# Patient Record
Sex: Female | Born: 1975 | Hispanic: Yes | Marital: Married | Smoking: Never smoker
Health system: Southern US, Community
[De-identification: ages and names within clinical notes are randomized; demographics above are authoritative.]

## PROBLEM LIST (undated history)

## (undated) DIAGNOSIS — R7303 Prediabetes: Secondary | ICD-10-CM

## (undated) DIAGNOSIS — R0600 Dyspnea, unspecified: Secondary | ICD-10-CM

## (undated) DIAGNOSIS — M542 Cervicalgia: Secondary | ICD-10-CM

## (undated) DIAGNOSIS — G971 Other reaction to spinal and lumbar puncture: Secondary | ICD-10-CM

## (undated) DIAGNOSIS — E538 Deficiency of other specified B group vitamins: Secondary | ICD-10-CM

## (undated) DIAGNOSIS — F419 Anxiety disorder, unspecified: Secondary | ICD-10-CM

## (undated) DIAGNOSIS — Q231 Congenital insufficiency of aortic valve: Secondary | ICD-10-CM

## (undated) DIAGNOSIS — R002 Palpitations: Secondary | ICD-10-CM

## (undated) DIAGNOSIS — Q2381 Bicuspid aortic valve: Secondary | ICD-10-CM

## (undated) DIAGNOSIS — J189 Pneumonia, unspecified organism: Secondary | ICD-10-CM

## (undated) DIAGNOSIS — M47812 Spondylosis without myelopathy or radiculopathy, cervical region: Secondary | ICD-10-CM

## (undated) DIAGNOSIS — J4520 Mild intermittent asthma, uncomplicated: Secondary | ICD-10-CM

## (undated) DIAGNOSIS — L659 Nonscarring hair loss, unspecified: Secondary | ICD-10-CM

## (undated) HISTORY — PX: APPENDECTOMY: SHX54

## (undated) HISTORY — PX: ABDOMINAL HYSTERECTOMY: SHX81

## (undated) HISTORY — PX: OTHER SURGICAL HISTORY: SHX169

---

## 2020-09-27 ENCOUNTER — Other Ambulatory Visit: Payer: Self-pay | Admitting: Physical Medicine & Rehabilitation

## 2020-09-27 DIAGNOSIS — G8929 Other chronic pain: Secondary | ICD-10-CM

## 2020-09-27 DIAGNOSIS — M542 Cervicalgia: Secondary | ICD-10-CM

## 2020-09-27 DIAGNOSIS — M5441 Lumbago with sciatica, right side: Secondary | ICD-10-CM

## 2020-09-27 DIAGNOSIS — M5412 Radiculopathy, cervical region: Secondary | ICD-10-CM

## 2020-09-29 NOTE — Progress Notes (Signed)
Patient on schedule for myelogram 9/29, called and spoke with patient on phone with pre procedure instructions given. Made aware to be here @ 0930, only liquids after MN, NPO after 0500, driver post procedure/recovery/discharge. Expect to be here at least 2 hours post procedure/recovery. Patient stated not taking buspar,holding meloxicam after Monday 9/26, stated understanding.

## 2020-10-05 ENCOUNTER — Ambulatory Visit: Admission: RE | Admit: 2020-10-05 | Payer: Managed Care, Other (non HMO) | Source: Ambulatory Visit

## 2020-10-05 ENCOUNTER — Ambulatory Visit
Admission: RE | Admit: 2020-10-05 | Discharge: 2020-10-05 | Disposition: A | Discharge status: Home / Self Care | Payer: Managed Care, Other (non HMO) | Source: Ambulatory Visit | Attending: Physical Medicine & Rehabilitation | Admitting: Physical Medicine & Rehabilitation

## 2020-10-06 ENCOUNTER — Other Ambulatory Visit: Payer: Self-pay | Admitting: Physical Medicine & Rehabilitation

## 2020-10-06 DIAGNOSIS — G8929 Other chronic pain: Secondary | ICD-10-CM

## 2020-10-06 DIAGNOSIS — M542 Cervicalgia: Secondary | ICD-10-CM

## 2020-10-12 ENCOUNTER — Other Ambulatory Visit: Payer: Managed Care, Other (non HMO)

## 2020-10-13 ENCOUNTER — Ambulatory Visit
Admission: RE | Admit: 2020-10-13 | Discharge: 2020-10-13 | Disposition: A | Discharge status: Home / Self Care | Payer: Managed Care, Other (non HMO) | Source: Ambulatory Visit | Attending: Physical Medicine & Rehabilitation | Admitting: Physical Medicine & Rehabilitation

## 2020-10-13 ENCOUNTER — Other Ambulatory Visit: Payer: Self-pay

## 2020-10-13 DIAGNOSIS — M542 Cervicalgia: Secondary | ICD-10-CM

## 2020-10-13 DIAGNOSIS — G8929 Other chronic pain: Secondary | ICD-10-CM

## 2020-10-13 DIAGNOSIS — M545 Low back pain, unspecified: Secondary | ICD-10-CM

## 2020-10-13 MED ORDER — IOPAMIDOL (ISOVUE-M 300) INJECTION 61%
10.0000 mL | Freq: Once | INTRAMUSCULAR | Status: AC | PRN
  Administered 2020-10-13: 10 mL via INTRATHECAL

## 2020-10-13 MED ORDER — DIAZEPAM 5 MG PO TABS
10.0000 mg | ORAL_TABLET | Freq: Once | ORAL | Status: AC
  Administered 2020-10-13: 10 mg via ORAL

## 2020-10-13 MED ORDER — ONDANSETRON HCL 4 MG/2ML IJ SOLN: 4.0000 mg | Freq: Once | INTRAMUSCULAR | Status: DC | PRN

## 2020-10-13 MED ORDER — MEPERIDINE HCL 50 MG/ML IJ SOLN: 50.0000 mg | Freq: Once | INTRAMUSCULAR | Status: DC | PRN

## 2020-10-13 NOTE — Progress Notes (Signed)
Pt reports spinal cord stimulator has been switched into procedure mode for myelogram procedure.

## 2020-10-13 NOTE — Discharge Instructions (Signed)

## 2020-10-16 ENCOUNTER — Other Ambulatory Visit: Payer: Self-pay | Admitting: Physical Medicine & Rehabilitation

## 2020-10-16 DIAGNOSIS — G8929 Other chronic pain: Secondary | ICD-10-CM

## 2020-10-17 ENCOUNTER — Ambulatory Visit
Admission: RE | Admit: 2020-10-17 | Discharge: 2020-10-17 | Disposition: A | Discharge status: Home / Self Care | Payer: Managed Care, Other (non HMO) | Source: Ambulatory Visit | Attending: Physical Medicine & Rehabilitation | Admitting: Physical Medicine & Rehabilitation

## 2020-10-17 ENCOUNTER — Other Ambulatory Visit: Payer: Self-pay

## 2020-10-17 DIAGNOSIS — G8929 Other chronic pain: Secondary | ICD-10-CM

## 2020-10-17 MED ORDER — IOPAMIDOL (ISOVUE-M 200) INJECTION 41%
1.0000 mL | Freq: Once | INTRAMUSCULAR | Status: AC
  Administered 2020-10-17: 1 mL via EPIDURAL

## 2020-10-17 NOTE — Discharge Instructions (Signed)

## 2020-10-17 NOTE — Progress Notes (Signed)
20cc of blood drawn from pts R AC for blood patch. 1 successful attempt, pt tolerated well. Gauze and tape applied after.  

## 2020-11-15 ENCOUNTER — Other Ambulatory Visit: Payer: Self-pay | Admitting: Neurosurgery

## 2020-12-06 ENCOUNTER — Other Ambulatory Visit: Payer: Managed Care, Other (non HMO)

## 2020-12-07 ENCOUNTER — Encounter
Admission: RE | Admit: 2020-12-07 | Discharge: 2020-12-07 | Disposition: A | Discharge status: Home / Self Care | Payer: Managed Care, Other (non HMO) | Source: Ambulatory Visit | Attending: Neurosurgery | Admitting: Neurosurgery

## 2020-12-07 ENCOUNTER — Other Ambulatory Visit: Payer: Self-pay

## 2020-12-07 DIAGNOSIS — Q231 Congenital insufficiency of aortic valve: Secondary | ICD-10-CM | POA: Diagnosis not present

## 2020-12-07 DIAGNOSIS — Z01818 Encounter for other preprocedural examination: Secondary | ICD-10-CM | POA: Diagnosis not present

## 2020-12-07 HISTORY — DX: Nonscarring hair loss, unspecified: L65.9

## 2020-12-07 HISTORY — DX: Dyspnea, unspecified: R06.00

## 2020-12-07 HISTORY — DX: Palpitations: R00.2

## 2020-12-07 HISTORY — DX: Anxiety disorder, unspecified: F41.9

## 2020-12-07 HISTORY — DX: Congenital insufficiency of aortic valve: Q23.1

## 2020-12-07 HISTORY — DX: Other reaction to spinal and lumbar puncture: G97.1

## 2020-12-07 HISTORY — DX: Mild intermittent asthma, uncomplicated: J45.20

## 2020-12-07 HISTORY — DX: Spondylosis without myelopathy or radiculopathy, cervical region: M47.812

## 2020-12-07 HISTORY — DX: Pneumonia, unspecified organism: J18.9

## 2020-12-07 HISTORY — DX: Prediabetes: R73.03

## 2020-12-07 HISTORY — DX: Deficiency of other specified B group vitamins: E53.8

## 2020-12-07 HISTORY — DX: Bicuspid aortic valve: Q23.81

## 2020-12-07 HISTORY — DX: Cervicalgia: M54.2

## 2020-12-07 LAB — CBC
HCT: 41.3 % (ref 36.0–46.0)
Hemoglobin: 13.5 g/dL (ref 12.0–15.0)
MCH: 29.2 pg (ref 26.0–34.0)
MCHC: 32.7 g/dL (ref 30.0–36.0)
MCV: 89.4 fL (ref 80.0–100.0)
Platelets: 299 10*3/uL (ref 150–400)
RBC: 4.62 MIL/uL (ref 3.87–5.11)
RDW: 13.2 % (ref 11.5–15.5)
WBC: 8.2 10*3/uL (ref 4.0–10.5)
nRBC: 0 % (ref 0.0–0.2)

## 2020-12-07 LAB — URINALYSIS, ROUTINE W REFLEX MICROSCOPIC
Bilirubin Urine: NEGATIVE
Glucose, UA: NEGATIVE mg/dL
Ketones, ur: NEGATIVE mg/dL
Leukocytes,Ua: NEGATIVE
Nitrite: NEGATIVE
Protein, ur: NEGATIVE mg/dL
Specific Gravity, Urine: 1.025 (ref 1.005–1.030)
pH: 6.5 (ref 5.0–8.0)

## 2020-12-07 LAB — BASIC METABOLIC PANEL
Anion gap: 6 (ref 5–15)
BUN: 12 mg/dL (ref 6–20)
CO2: 27 mmol/L (ref 22–32)
Calcium: 8.9 mg/dL (ref 8.9–10.3)
Chloride: 103 mmol/L (ref 98–111)
Creatinine, Ser: 0.53 mg/dL (ref 0.44–1.00)
GFR, Estimated: >60 mL/min (ref >60)
Glucose, Bld: 90 mg/dL (ref 70–99)
Potassium: 4 mmol/L (ref 3.5–5.1)
Sodium: 136 mmol/L (ref 135–145)

## 2020-12-07 LAB — URINALYSIS, MICROSCOPIC (REFLEX): WBC, UA: NONE SEEN WBC/hpf (ref 0–5)

## 2020-12-07 LAB — TYPE AND SCREEN
ABO/RH(D): A POS
Antibody Screen: NEGATIVE

## 2020-12-07 LAB — APTT: aPTT: 29 seconds (ref 24–36)

## 2020-12-07 LAB — PROTIME-INR
INR: 1 (ref 0.8–1.2)
Prothrombin Time: 13 seconds (ref 11.4–15.2)

## 2020-12-07 LAB — SURGICAL PCR SCREEN
MRSA, PCR: NEGATIVE
Staphylococcus aureus: NEGATIVE

## 2020-12-07 NOTE — Patient Instructions (Addendum)
Your procedure is scheduled on: 12/18/20 - Monday Report to the Registration Desk on the 1st floor of the [facility]. To find out your arrival time, please call [phone] between 1PM - 3PM on: 12/15/20   REMEMBER: Instructions that are not followed completely may result in serious medical risk, up to and including death; or upon the discretion of your surgeon and anesthesiologist your surgery may need to be rescheduled.  Do not eat food after midnight the night before surgery.  No gum chewing, lozengers or hard candies.  You may however, drink CLEAR liquids up to 2 hours before you are scheduled to arrive for your surgery. Do not drink anything within 2 hours of your scheduled arrival time.  Clear liquids include: - water  - apple juice without pulp - gatorade (not RED, PURPLE, OR BLUE) - black coffee or tea (Do NOT add milk or creamers to the coffee or tea) Do NOT drink anything that is not on this list.  TAKE THESE MEDICATIONS THE MORNING OF SURGERY WITH A SIP OF WATER:  - busPIRone (BUSPAR) 5 MG tablet - Use albuterol (VENTOLIN HFA) 108 (90 Base) MCG/ACT inhaler on the day of surgery and bring to the hospital.  One week prior to surgery: Hold these medications 1 week prior to procedure, may resume taking 3 months after procedure. Stop Anti-inflammatories (NSAIDS) such as Advil, Aleve, Ibuprofen, Motrin, Naproxen, Naprosyn and Aspirin based products such as Excedrin, Goodys Powder, BC Powder.  Stop ANY OVER THE COUNTER supplements until after surgery.  You may however, continue to take Tylenol if needed for pain up until the day of surgery.  No Alcohol for 24 hours before or after surgery.  No Smoking including e-cigarettes for 24 hours prior to surgery.  No chewable tobacco products for at least 6 hours prior to surgery.  No nicotine patches on the day of surgery.  Do not use any "recreational" drugs for at least a week prior to your surgery.  Please be advised that  the combination of cocaine and anesthesia may have negative outcomes, up to and including death. If you test positive for cocaine, your surgery will be cancelled.  On the morning of surgery brush your teeth with toothpaste and water, you may rinse your mouth with mouthwash if you wish. Do not swallow any toothpaste or mouthwash.  Use CHG Soap or wipes as directed on instruction sheet.  Do not wear jewelry, make-up, hairpins, clips or nail polish.  Do not wear lotions, powders, or perfumes.   Do not shave body from the neck down 48 hours prior to surgery just in case you cut yourself which could leave a site for infection.  Also, freshly shaved skin may become irritated if using the CHG soap.  Contact lenses, hearing aids and dentures may not be worn into surgery.  Do not bring valuables to the hospital. [facility] is not responsible for any missing/lost belongings or valuables.   Notify your doctor if there is any change in your medical condition (cold, fever, infection).  Wear comfortable clothing (specific to your surgery type) to the hospital.  After surgery, you can help prevent lung complications by doing breathing exercises.  Take deep breaths and cough every 1-2 hours. Your doctor may order a device called an Incentive Spirometer to help you take deep breaths. When coughing or sneezing, hold a pillow firmly against your incision with both hands. This is called "splinting." Doing this helps protect your incision. It also decreases belly discomfort.  If you are being admitted to the hospital overnight, leave your suitcase in the car. After surgery it may be brought to your room.  If you are being discharged the day of surgery, you will not be allowed to drive home. You will need a responsible adult (18 years or older) to drive you home and stay with you that night.   If you are taking public transportation, you will need to have a responsible adult (18 years or older) with  you. Please confirm with your physician that it is acceptable to use public transportation.   Please call the [facility] at [phone] if you have any questions about these instructions.  Surgery Visitation Policy:  Patients undergoing a surgery or procedure may have one family member or support person with them as long as that person is not COVID-19 positive or experiencing its symptoms.  That person may remain in the waiting area during the procedure and may rotate out with other people.  Inpatient Visitation:    Visiting hours are 7 a.m. to 8 p.m. Up to two visitors ages 16+ are allowed at one time in a patient room. The visitors may rotate out with other people during the day. Visitors must check out when they leave, or other visitors will not be allowed. One designated support person may remain overnight. The visitor must pass COVID-19 screenings, use hand sanitizer when entering and exiting the patient's room and wear a mask at all times, including in the patient's room. Patients must also wear a mask when staff or their visitor are in the room. Masking is required regardless of vaccination status.

## 2020-12-17 MED ORDER — CHLORHEXIDINE GLUCONATE 0.12 % MT SOLN: 15.0000 mL | Freq: Once | OROMUCOSAL | Status: AC

## 2020-12-17 MED ORDER — LACTATED RINGERS IV SOLN: INTRAVENOUS | Status: DC

## 2020-12-17 MED ORDER — FAMOTIDINE 20 MG PO TABS: 20.0000 mg | ORAL_TABLET | Freq: Once | ORAL | Status: AC

## 2020-12-17 MED ORDER — ORAL CARE MOUTH RINSE: 15.0000 mL | Freq: Once | OROMUCOSAL | Status: AC

## 2020-12-18 ENCOUNTER — Other Ambulatory Visit: Payer: Self-pay

## 2020-12-18 ENCOUNTER — Encounter
Admission: RE | Disposition: A | Discharge status: Home / Self Care | Payer: Self-pay | Source: Ambulatory Visit | Attending: Neurosurgery

## 2020-12-18 ENCOUNTER — Ambulatory Visit
Admission: RE | Admit: 2020-12-18 | Discharge: 2020-12-18 | Disposition: A | Discharge status: Home / Self Care | Payer: Managed Care, Other (non HMO) | Source: Ambulatory Visit | Attending: Neurosurgery | Admitting: Neurosurgery

## 2020-12-18 ENCOUNTER — Ambulatory Visit: Payer: Managed Care, Other (non HMO) | Admitting: Anesthesiology

## 2020-12-18 ENCOUNTER — Ambulatory Visit: Payer: Managed Care, Other (non HMO) | Admitting: Urgent Care

## 2020-12-18 ENCOUNTER — Encounter: Payer: Self-pay | Admitting: Neurosurgery

## 2020-12-18 ENCOUNTER — Ambulatory Visit: Payer: Managed Care, Other (non HMO)

## 2020-12-18 DIAGNOSIS — M4802 Spinal stenosis, cervical region: Secondary | ICD-10-CM | POA: Insufficient documentation

## 2020-12-18 DIAGNOSIS — M1990 Unspecified osteoarthritis, unspecified site: Secondary | ICD-10-CM | POA: Insufficient documentation

## 2020-12-18 DIAGNOSIS — Z419 Encounter for procedure for purposes other than remedying health state, unspecified: Secondary | ICD-10-CM

## 2020-12-18 DIAGNOSIS — M5126 Other intervertebral disc displacement, lumbar region: Secondary | ICD-10-CM | POA: Insufficient documentation

## 2020-12-18 DIAGNOSIS — M5412 Radiculopathy, cervical region: Secondary | ICD-10-CM | POA: Insufficient documentation

## 2020-12-18 DIAGNOSIS — J45909 Unspecified asthma, uncomplicated: Secondary | ICD-10-CM | POA: Insufficient documentation

## 2020-12-18 HISTORY — PX: SPINAL CORD STIMULATOR REMOVAL: SHX5379

## 2020-12-18 HISTORY — PX: ANTERIOR CERVICAL DECOMP/DISCECTOMY FUSION: SHX1161

## 2020-12-18 LAB — ABO/RH: ABO/RH(D): A POS

## 2020-12-18 SURGERY — LUMBAR SPINAL CORD STIMULATOR REMOVAL
Anesthesia: General | Site: Neck

## 2020-12-18 MED ORDER — OXYCODONE HCL 5 MG/5ML PO SOLN: 5.0000 mg | Freq: Once | ORAL | Status: AC | PRN

## 2020-12-18 MED ORDER — PROPOFOL 10 MG/ML IV BOLUS
INTRAVENOUS | Status: AC
  Filled 2020-12-18: qty 80

## 2020-12-18 MED ORDER — FENTANYL CITRATE (PF) 100 MCG/2ML IJ SOLN
25.0000 ug | INTRAMUSCULAR | Status: DC | PRN
  Administered 2020-12-18: 25 ug via INTRAVENOUS

## 2020-12-18 MED ORDER — FENTANYL CITRATE (PF) 250 MCG/5ML IJ SOLN
INTRAMUSCULAR | Status: AC
  Filled 2020-12-18: qty 5

## 2020-12-18 MED ORDER — ROCURONIUM BROMIDE 100 MG/10ML IV SOLN
INTRAVENOUS | Status: DC | PRN
  Administered 2020-12-18: 40 mg via INTRAVENOUS

## 2020-12-18 MED ORDER — BUPIVACAINE-EPINEPHRINE (PF) 0.5% -1:200000 IJ SOLN
INTRAMUSCULAR | Status: AC
  Filled 2020-12-18: qty 30

## 2020-12-18 MED ORDER — METHOCARBAMOL 1000 MG/10ML IJ SOLN
500.0000 mg | Freq: Once | INTRAVENOUS | Status: DC
  Filled 2020-12-18: qty 5

## 2020-12-18 MED ORDER — PHENYLEPHRINE HCL (PRESSORS) 10 MG/ML IV SOLN
INTRAVENOUS | Status: AC
  Filled 2020-12-18: qty 1

## 2020-12-18 MED ORDER — PROMETHAZINE HCL 25 MG/ML IJ SOLN: 6.2500 mg | INTRAMUSCULAR | Status: DC | PRN

## 2020-12-18 MED ORDER — CHLORHEXIDINE GLUCONATE 0.12 % MT SOLN
OROMUCOSAL | Status: AC
  Administered 2020-12-18: 15 mL via OROMUCOSAL
  Filled 2020-12-18: qty 15

## 2020-12-18 MED ORDER — MIDAZOLAM HCL 2 MG/2ML IJ SOLN
INTRAMUSCULAR | Status: AC
  Filled 2020-12-18: qty 2

## 2020-12-18 MED ORDER — FAMOTIDINE 20 MG PO TABS
ORAL_TABLET | ORAL | Status: AC
  Administered 2020-12-18: 20 mg via ORAL
  Filled 2020-12-18: qty 1

## 2020-12-18 MED ORDER — CEFAZOLIN SODIUM-DEXTROSE 2-4 GM/100ML-% IV SOLN
INTRAVENOUS | Status: AC
  Filled 2020-12-18: qty 100

## 2020-12-18 MED ORDER — REMIFENTANIL HCL 1 MG IV SOLR
INTRAVENOUS | Status: AC
  Filled 2020-12-18: qty 1000

## 2020-12-18 MED ORDER — ACETAMINOPHEN 10 MG/ML IV SOLN
INTRAVENOUS | Status: AC
  Filled 2020-12-18: qty 100

## 2020-12-18 MED ORDER — ONDANSETRON HCL 4 MG/2ML IJ SOLN
INTRAMUSCULAR | Status: DC | PRN
  Administered 2020-12-18: 4 mg via INTRAVENOUS

## 2020-12-18 MED ORDER — VANCOMYCIN HCL 1000 MG IV SOLR
INTRAVENOUS | Status: AC
  Filled 2020-12-18: qty 20

## 2020-12-18 MED ORDER — OXYCODONE HCL 5 MG PO TABS
5.0000 mg | ORAL_TABLET | ORAL | 0 refills | Status: AC | PRN
Start: 2020-12-18 — End: 2020-12-23

## 2020-12-18 MED ORDER — OXYCODONE HCL 5 MG PO TABS
5.0000 mg | ORAL_TABLET | Freq: Once | ORAL | Status: AC | PRN
  Administered 2020-12-18: 5 mg via ORAL

## 2020-12-18 MED ORDER — FENTANYL CITRATE (PF) 100 MCG/2ML IJ SOLN
INTRAMUSCULAR | Status: AC
  Administered 2020-12-18: 25 ug via INTRAVENOUS
  Filled 2020-12-18: qty 2

## 2020-12-18 MED ORDER — ONDANSETRON HCL 4 MG/2ML IJ SOLN
INTRAMUSCULAR | Status: AC
  Filled 2020-12-18: qty 2

## 2020-12-18 MED ORDER — OXYCODONE HCL 5 MG PO TABS
ORAL_TABLET | ORAL | Status: AC
  Filled 2020-12-18: qty 1

## 2020-12-18 MED ORDER — METHOCARBAMOL 500 MG PO TABS: 500.0000 mg | ORAL_TABLET | Freq: Four times a day (QID) | ORAL | 0 refills | Status: AC

## 2020-12-18 MED ORDER — GLYCOPYRROLATE 0.2 MG/ML IJ SOLN
INTRAMUSCULAR | Status: DC | PRN
  Administered 2020-12-18: .2 mg via INTRAVENOUS

## 2020-12-18 MED ORDER — ACETAMINOPHEN 10 MG/ML IV SOLN
INTRAVENOUS | Status: DC | PRN
  Administered 2020-12-18: 1000 mg via INTRAVENOUS

## 2020-12-18 MED ORDER — PROPOFOL 10 MG/ML IV BOLUS
INTRAVENOUS | Status: DC | PRN
  Administered 2020-12-18: 150 mg via INTRAVENOUS
  Administered 2020-12-18: 50 mg via INTRAVENOUS

## 2020-12-18 MED ORDER — ONDANSETRON HCL 4 MG/2ML IJ SOLN
4.0000 mg | Freq: Once | INTRAMUSCULAR | Status: AC
  Administered 2020-12-18: 4 mg via INTRAVENOUS

## 2020-12-18 MED ORDER — DEXAMETHASONE SODIUM PHOSPHATE 10 MG/ML IJ SOLN
INTRAMUSCULAR | Status: DC | PRN
  Administered 2020-12-18: 10 mg via INTRAVENOUS

## 2020-12-18 MED ORDER — ONDANSETRON HCL 4 MG/2ML IJ SOLN: 4.0000 mg | Freq: Once | INTRAMUSCULAR | Status: DC

## 2020-12-18 MED ORDER — SODIUM CHLORIDE FLUSH 0.9 % IV SOLN
INTRAVENOUS | Status: AC
  Filled 2020-12-18: qty 10

## 2020-12-18 MED ORDER — CLINDAMYCIN PHOSPHATE 900 MG/50ML IV SOLN
900.0000 mg | INTRAVENOUS | Status: AC
  Administered 2020-12-18: 900 mg via INTRAVENOUS

## 2020-12-18 MED ORDER — LIDOCAINE-EPINEPHRINE 1 %-1:100000 IJ SOLN
INTRAMUSCULAR | Status: AC
  Filled 2020-12-18: qty 1

## 2020-12-18 MED ORDER — VANCOMYCIN HCL 1000 MG IV SOLR
INTRAVENOUS | Status: DC | PRN
  Administered 2020-12-18: 1000 mg

## 2020-12-18 MED ORDER — SODIUM CHLORIDE 0.9 % IV SOLN
INTRAVENOUS | Status: DC | PRN
  Administered 2020-12-18: .1 ug/kg/min via INTRAVENOUS

## 2020-12-18 MED ORDER — SUGAMMADEX SODIUM 200 MG/2ML IV SOLN
INTRAVENOUS | Status: DC | PRN
  Administered 2020-12-18: 200 mg via INTRAVENOUS

## 2020-12-18 MED ORDER — CLINDAMYCIN PHOSPHATE 900 MG/50ML IV SOLN
INTRAVENOUS | Status: AC
  Filled 2020-12-18: qty 50

## 2020-12-18 MED ORDER — LIDOCAINE HCL (CARDIAC) PF 100 MG/5ML IV SOSY
PREFILLED_SYRINGE | INTRAVENOUS | Status: DC | PRN
  Administered 2020-12-18: 60 mg via INTRAVENOUS

## 2020-12-18 MED ORDER — SENNA 8.6 MG PO TABS: 1.0000 | ORAL_TABLET | Freq: Every day | ORAL | 0 refills | Status: AC | PRN

## 2020-12-18 MED ORDER — ACETAMINOPHEN 10 MG/ML IV SOLN: 1000.0000 mg | Freq: Once | INTRAVENOUS | Status: DC | PRN

## 2020-12-18 MED ORDER — FENTANYL CITRATE (PF) 100 MCG/2ML IJ SOLN
INTRAMUSCULAR | Status: DC | PRN
  Administered 2020-12-18: 100 ug via INTRAVENOUS
  Administered 2020-12-18: 50 ug via INTRAVENOUS

## 2020-12-18 MED ORDER — SURGIFLO WITH THROMBIN (HEMOSTATIC MATRIX KIT) OPTIME
TOPICAL | Status: DC | PRN
  Administered 2020-12-18: 1

## 2020-12-18 MED ORDER — BUPIVACAINE-EPINEPHRINE (PF) 0.5% -1:200000 IJ SOLN
INTRAMUSCULAR | Status: DC | PRN
  Administered 2020-12-18: 1 mL
  Administered 2020-12-18: 10 mL

## 2020-12-18 MED ORDER — 0.9 % SODIUM CHLORIDE (POUR BTL) OPTIME
TOPICAL | Status: DC | PRN
  Administered 2020-12-18: 1000 mL

## 2020-12-18 MED ORDER — METHOCARBAMOL 500 MG PO TABS
ORAL_TABLET | ORAL | Status: AC
  Administered 2020-12-18: 500 mg
  Filled 2020-12-18: qty 1

## 2020-12-18 MED ORDER — MIDAZOLAM HCL 2 MG/2ML IJ SOLN
INTRAMUSCULAR | Status: DC | PRN
  Administered 2020-12-18: 2 mg via INTRAVENOUS

## 2020-12-18 MED ORDER — PHENYLEPHRINE HCL (PRESSORS) 10 MG/ML IV SOLN
INTRAVENOUS | Status: DC | PRN
  Administered 2020-12-18 (×2): 150 ug via INTRAVENOUS
  Administered 2020-12-18: 100 ug via INTRAVENOUS

## 2020-12-18 SURGICAL SUPPLY — 96 items
ADH SKN CLS APL DERMABOND .7 (GAUZE/BANDAGES/DRESSINGS) ×4
AGENT HMST KT MTR STRL THRMB (HEMOSTASIS) ×2
APL PRP STRL LF DISP 70% ISPRP (MISCELLANEOUS) ×4
APL SRG 60D 8 XTD TIP BNDBL (TIP)
BIT DRILL 13 (BIT) IMPLANT
BLADE BOVIE TIP EXT 4 (BLADE) ×3 IMPLANT
BLADE SURG 15 STRL LF DISP TIS (BLADE) ×2 IMPLANT
BLADE SURG 15 STRL SS (BLADE) ×3
BUR DIAMOND COARSE 4.0 RND (BURR) IMPLANT
BUR NEURO DRILL SOFT 3.0X3.8M (BURR) ×3 IMPLANT
CHLORAPREP W/TINT 26 (MISCELLANEOUS) ×6 IMPLANT
CNTNR SPEC 2.5X3XGRAD LEK (MISCELLANEOUS) ×2
CONT SPEC 4OZ STER OR WHT (MISCELLANEOUS) ×1
CONT SPEC 4OZ STRL OR WHT (MISCELLANEOUS) ×2
CONTAINER SPEC 2.5X3XGRAD LEK (MISCELLANEOUS) ×2 IMPLANT
COUNTER NEEDLE 20/40 LG (NEEDLE) ×3 IMPLANT
COVER LIGHT HANDLE STERIS (MISCELLANEOUS) ×8 IMPLANT
CUP MEDICINE 2OZ PLAST GRAD ST (MISCELLANEOUS) ×6 IMPLANT
DERMABOND ADVANCED (GAUZE/BANDAGES/DRESSINGS) ×2
DERMABOND ADVANCED .7 DNX12 (GAUZE/BANDAGES/DRESSINGS) ×2 IMPLANT
DEVICE DSSCT PLSMBLD 3.0S LGHT (MISCELLANEOUS) IMPLANT
DRAPE C-ARM 42X72 X-RAY (DRAPES) ×6 IMPLANT
DRAPE C-ARM XRAY 36X54 (DRAPES) ×4 IMPLANT
DRAPE LAPAROTOMY 100X77 ABD (DRAPES) ×3 IMPLANT
DRAPE MICROSCOPE SPINE 48X150 (DRAPES) ×3 IMPLANT
DRAPE SURG 17X11 SM STRL (DRAPES) ×4 IMPLANT
DRAPE THYROID T SHEET (DRAPES) ×3 IMPLANT
DRSG OPSITE POSTOP 3X4 (GAUZE/BANDAGES/DRESSINGS) ×2 IMPLANT
DURASEAL APPLICATOR TIP (TIP) IMPLANT
DURASEAL SPINE SEALANT 3ML (MISCELLANEOUS) IMPLANT
ELECT CAUTERY BLADE TIP 2.5 (TIP) ×3
ELECT EZSTD 165MM 6.5IN (MISCELLANEOUS) ×3
ELECT REM PT RETURN 9FT ADLT (ELECTROSURGICAL) ×6
ELECTRODE CAUTERY BLDE TIP 2.5 (TIP) ×2 IMPLANT
ELECTRODE EZSTD 165MM 6.5IN (MISCELLANEOUS) ×2 IMPLANT
ELECTRODE REM PT RTRN 9FT ADLT (ELECTROSURGICAL) ×2 IMPLANT
FEE INTRAOP CADWELL SUPPLY NCS (MISCELLANEOUS) ×2 IMPLANT
FEE INTRAOP MONITOR IMPULS NCS (MISCELLANEOUS) IMPLANT
GAUZE 4X4 16PLY ~~LOC~~+RFID DBL (SPONGE) ×4 IMPLANT
GAUZE SPONGE 4X4 12PLY STRL (GAUZE/BANDAGES/DRESSINGS) ×4 IMPLANT
GLOVE SRG 8 PF TXTR STRL LF DI (GLOVE) ×2 IMPLANT
GLOVE SURG SYN 6.5 ES PF (GLOVE) ×6 IMPLANT
GLOVE SURG SYN 6.5 PF PI (GLOVE) ×4 IMPLANT
GLOVE SURG SYN 7.0 (GLOVE) ×6 IMPLANT
GLOVE SURG SYN 7.0 PF PI (GLOVE) ×4 IMPLANT
GLOVE SURG SYN 8.0 (GLOVE) ×6 IMPLANT
GLOVE SURG SYN 8.0 PF PI (GLOVE) ×4 IMPLANT
GLOVE SURG UNDER POLY LF SZ6.5 (GLOVE) ×6 IMPLANT
GLOVE SURG UNDER POLY LF SZ8 (GLOVE) ×6
GOWN SRG LRG LVL 4 IMPRV REINF (GOWNS) ×4 IMPLANT
GOWN STRL REIN LRG LVL4 (GOWNS) ×6
GOWN STRL REUS W/ TWL XL LVL3 (GOWN DISPOSABLE) ×4 IMPLANT
GOWN STRL REUS W/TWL XL LVL3 (GOWN DISPOSABLE) ×6
GRADUATE 1200CC STRL 31836 (MISCELLANEOUS) ×3 IMPLANT
INTRAOP CADWELL SUPPLY FEE NCS (MISCELLANEOUS) ×2
INTRAOP DISP SUPPLY FEE NCS (MISCELLANEOUS) ×3
INTRAOP MONITOR FEE IMPULS NCS (MISCELLANEOUS)
INTRAOP MONITOR FEE IMPULSE (MISCELLANEOUS)
KIT TURNOVER KIT A (KITS) ×3 IMPLANT
KIT WILSON FRAME (KITS) ×3 IMPLANT
MANIFOLD NEPTUNE II (INSTRUMENTS) ×3 IMPLANT
MARKER SKIN DUAL TIP RULER LAB (MISCELLANEOUS) ×7 IMPLANT
NDL SAFETY ECLIPSE 18X1.5 (NEEDLE) ×2 IMPLANT
NDL SPNL 22GX3.5 QUINCKE BK (NEEDLE) ×2 IMPLANT
NEEDLE HYPO 18GX1.5 SHARP (NEEDLE) ×6
NEEDLE HYPO 22GX1.5 SAFETY (NEEDLE) ×3 IMPLANT
NEEDLE SPNL 22GX3.5 QUINCKE BK (NEEDLE) ×6 IMPLANT
NS IRRIG 1000ML POUR BTL (IV SOLUTION) ×3 IMPLANT
PACK LAMINECTOMY NEURO (CUSTOM PROCEDURE TRAY) ×3 IMPLANT
PAD ARMBOARD 7.5X6 YLW CONV (MISCELLANEOUS) ×4 IMPLANT
PIN CASPAR 14 (PIN) ×2 IMPLANT
PIN CASPAR 14MM (PIN) ×3
PLASMABLADE 3.0S W/LIGHT (MISCELLANEOUS)
PLATE ZEVO 1LVL 17MM (Plate) ×1 IMPLANT
SCREW 13MM (Screw) ×4 IMPLANT
SPACER BONE CORNERSTONE 6X14 (Orthopedic Implant) ×1 IMPLANT
SPONGE KITTNER 5P (MISCELLANEOUS) ×3 IMPLANT
STAPLER SKIN PROX 35W (STAPLE) IMPLANT
SURGIFLO W/THROMBIN 8M KIT (HEMOSTASIS) ×3 IMPLANT
SUT BONE WAX W31G (SUTURE) ×1 IMPLANT
SUT ETHILON 3-0 FS-10 30 BLK (SUTURE)
SUT POLYSORB 2-0 5X18 GS-10 (SUTURE) ×7 IMPLANT
SUT VIC AB 0 CT1 18XCR BRD 8 (SUTURE) ×4 IMPLANT
SUT VIC AB 0 CT1 8-18 (SUTURE) ×3
SUT VIC AB 3-0 SH 8-18 (SUTURE) ×1 IMPLANT
SUT VICRYL 3-0 CR8 SH (SUTURE) ×3 IMPLANT
SUTURE EHLN 3-0 FS-10 30 BLK (SUTURE) ×4 IMPLANT
SYR 10ML LL (SYRINGE) ×6 IMPLANT
SYR 20ML LL LF (SYRINGE) ×3 IMPLANT
SYR 30ML LL (SYRINGE) ×6 IMPLANT
SYR 3ML LL SCALE MARK (SYRINGE) ×3 IMPLANT
TAPE CLOTH 3X10 WHT NS LF (GAUZE/BANDAGES/DRESSINGS) ×4 IMPLANT
TOWEL OR 17X26 4PK STRL BLUE (TOWEL DISPOSABLE) ×6 IMPLANT
TRAY FOLEY MTR SLVR 16FR STAT (SET/KITS/TRAYS/PACK) ×1 IMPLANT
TUBING CONNECTING 10 (TUBING) ×3 IMPLANT
WATER STERILE IRR 500ML POUR (IV SOLUTION) ×2 IMPLANT

## 2020-12-18 NOTE — Progress Notes (Signed)
Patient ambulated approximately 200 ft, voided twice. Tolerated food and fluids well with no issues. Patient states pain level is okay.

## 2020-12-18 NOTE — H&P (Signed)
Sheila Wallace is an 45 y.o. female.   Chief Complaint: Left arm pain, failed SCS HPI: Sheila Wallace is here for evaluation of ongoing left arm pain and numbness. She states the pain will start in the neck and go down the lateral side of the arm towards the hand in the middle digits. She does note some numbness in the fingers there. She denies any right-sided symptoms. She does feel that the symptoms have been going on for over a year. She did have a spinal cord stimulator placed previously and she states the trial did help and the device also helped the first couple months but unfortunately lately she has not been able to get any relief from it. She has worked with the stimulator representative but they have not been able to target her pain.  She has been seen by physiatry recently and undergone injection in the neck. She states that she has been to physical therapy previously. The injection has helped but is not sustaining. The pain and numbness is still very bothersome and she has had a CT myelogram for evaluation. This showed left sided stenosis at C6/7 and we discussed decompression and fusion. She also requests removal of SCS as she is not using it  Past Medical History:  Diagnosis Date   Alopecia    Anxiety    Bicuspid aortic valve    Cervicalgia    Congenital insufficiency of aortic valve    Dyspnea    Facet arthritis of cervical region    Heart palpitations    Mild intermittent asthma without complication    Pneumonia    Pre-diabetes    Spinal headache    patient reports that with MRI with contrast that she has spinal leakaged that required blood patch   Vitamin B12 deficiency     Past Surgical History:  Procedure Laterality Date   ABDOMINAL HYSTERECTOMY     APPENDECTOMY     nasal polyp removed     ovarian cyst removed     x 3   placement of spinal cord stimulator      History reviewed. No pertinent family history. Social History:  reports that she has never smoked. She has  never been exposed to tobacco smoke. She has never used smokeless tobacco. She reports current alcohol use of about 1.0 standard drink per week. She reports that she does not use drugs.  Allergies:  Allergies  Allergen Reactions   Penicillins Hives, Shortness Of Breath, Itching and Swelling    Other reaction(s): Urticarial Rash    Medications Prior to Admission  Medication Sig Dispense Refill   acetaminophen (TYLENOL) 325 MG tablet Take 650 mg by mouth every 6 (six) hours as needed for moderate pain.     albuterol (VENTOLIN HFA) 108 (90 Base) MCG/ACT inhaler Inhale 2 puffs into the lungs every 6 (six) hours as needed for wheezing or shortness of breath.     busPIRone (BUSPAR) 5 MG tablet Take 5 mg by mouth daily.     cholecalciferol (VITAMIN D3) 25 MCG (1000 UNIT) tablet Take 1,000 Units by mouth daily.     loratadine (CLARITIN) 10 MG tablet Take 10 mg by mouth daily as needed for allergies.     vitamin B-12 (CYANOCOBALAMIN) 1000 MCG tablet Take 1,000 mcg by mouth daily.      No results found for this or any previous visit (from the past 48 hour(s)). No results found.  Review of Systems General ROS: Negative Psychological ROS: Negative Ophthalmic ROS: Negative ENT  ROS: Negative Hematological and Lymphatic ROS: Negative  Endocrine ROS: Negative Respiratory ROS: Negative Cardiovascular ROS: Negative Gastrointestinal ROS: Negative Genito-Urinary ROS: Negative Musculoskeletal ROS: Positive for neck pain Neurological ROS: Positive for left arm numbness and pain Dermatological ROS: Negative There were no vitals taken for this visit. Physical Exam  General appearance: Alert, cooperative, in no acute distress Head: Normocephalic, atraumatic Eyes: Normal, EOM intact Oropharynx: Wearing facemask Neck: Supple, range of motion appears full Back: Well-healed midline lumbar incision and left flank incision Ext: No edema in LE bilaterally  Neurologic exam:  Mental status: alertness:  alert, affect: normal Speech: fluent and clear Motor:strength symmetric 5/5 in bilateral upper and lower extremities Sensory: intact to light touch in all extremities with exception of decreased over the middle digit of the left hand  Gait: normal   Imaging: CT myelogram lumbar spine: There is a normal lordotic curvature. There is a normal alignment. There is spinal cord stimulator wires entering the upper lumbar area. There is no significant central or foraminal stenosis noted.  CT myelogram cervical spine: There is some straightening of the lordotic curvature with retained alignment. There is a spinal cord stimulator noted spanning from C2-C6 with wires extending beneath this. There is no obvious central stenosis. There are disc osteophyte complexes noted at C5-6 and C6-7 with some left-sided foraminal stenosis at the C6-7 level due to bony overgrowth.  Assessment/Plan Proceed with SCS removal and C6/7 ACDF  Deetta Perla, MD 12/18/2020, 6:36 AM

## 2020-12-18 NOTE — Discharge Summary (Signed)
Physician Discharge Summary  Patient ID: Sheila Wallace MRN: [id number] DOB/AGE: 06/11/1975 45 y.o.  Admit date: 12/18/2020 Discharge date: 12/18/2020  Admission Diagnoses: Cervical radiculopathy  Discharge Diagnoses:  Active Problems:   * No active hospital problems. *   Discharged Condition: good  Hospital Course:  Sheila Wallace is a 45 y.o presenting with cervical radiculopathy and a history of SCS. She underwent removal of SCS and C6-7 ACDF. Her interoperative course was uncomplicated. She was monitored in the PACU for 4 hours post-op and was discharged home after ambulating, urinating, and tolerating PO intake. She was given prescriptions for Oxycodone, Robaxin, and Senna.   Consults: None  Significant Diagnostic Studies: none  Treatments: surgery: as above.. See separately dictated operative report for further details.   Discharge Exam: Blood pressure (!) 127/112, pulse 80, temperature 97.8 F (36.6 C), temperature source Temporal, resp. rate 17, height 5\' 1"  (1.549 m), weight 69.4 kg, SpO2 100 %. CN II-XII grossly intact 5/5 throughout  Incisions c/d/I   Disposition: Discharge disposition: 01-Home or Self Care       Discharge Instructions     Remove dressing in 48 hours   Complete by: As directed       Allergies as of 12/18/2020       Reactions   Penicillins Hives, Shortness Of Breath, Itching, Swelling   Other reaction(s): Urticarial Rash        Medication List     TAKE these medications    acetaminophen 325 MG tablet Commonly known as: TYLENOL Take 650 mg by mouth every 6 (six) hours as needed for moderate pain.   albuterol 108 (90 Base) MCG/ACT inhaler Commonly known as: VENTOLIN HFA Inhale 2 puffs into the lungs every 6 (six) hours as needed for wheezing or shortness of breath.   busPIRone 5 MG tablet Commonly known as: BUSPAR Take 5 mg by mouth daily.   cholecalciferol 25 MCG (1000 UNIT) tablet Commonly known as: VITAMIN  D3 Take 1,000 Units by mouth daily.   loratadine 10 MG tablet Commonly known as: CLARITIN Take 10 mg by mouth daily as needed for allergies.   methocarbamol 500 MG tablet Commonly known as: Robaxin Take 1 tablet (500 mg total) by mouth 4 (four) times daily.   oxyCODONE 5 MG immediate release tablet Commonly known as: Roxicodone Take 1 tablet (5 mg total) by mouth every 4 (four) hours as needed for up to 5 days for severe pain.   senna 8.6 MG Tabs tablet Commonly known as: SENOKOT Take 1 tablet (8.6 mg total) by mouth daily as needed for mild constipation.   vitamin B-12 1000 MCG tablet Commonly known as: CYANOCOBALAMIN Take 1,000 mcg by mouth daily.        Follow-up Information     14/12/2020, [place] Follow up in 2 week(s).   Why: For incision check and psot-op f/u. This appointment should already be scheduled with the [facility] clinic. Please call the office with any questions regarding appointment date or time. Contact information: [address] [place] [phone]                 Signed: [phone] 12/18/2020, 10:00 AM

## 2020-12-18 NOTE — Discharge Instructions (Addendum)
Your surgeon has performed an operation on your cervical spine (neck) to relieve pressure on the spinal cord and/or nerves. This involved making an incision in the front of your neck and removing one or more of the discs that support your spine. Next, a small piece of bone, a titanium plate, and screws were used to fuse two or more of the vertebrae (bones) together.  The following are instructions to help in your recovery once you have been discharged from the hospital. Even if you feel well, it is important that you follow these activity guidelines. If you do not let your neck heal properly from the surgery, you can increase the chance of return of your symptoms and other complications.  * Do not take anti-inflammatory medications for 3 months after surgery (naproxen [Aleve], ibuprofen [Advil, Motrin], celecoxib [Celebrex], etc.). These medications can prevent your bones from healing properly.  Activity    No bending, lifting, or twisting ("BLT"). Avoid lifting objects heavier than 10 pounds (gallon milk jug).  Where possible, avoid household activities that involve lifting, bending, reaching, pushing, or pulling such as laundry, vacuuming, grocery shopping, and childcare. Try to arrange for help from friends and family for these activities while your back heals.  Increase physical activity slowly as tolerated.  Taking short walks is encouraged, but avoid strenuous exercise. Do not jog, run, bicycle, lift weights, or participate in any other exercises unless specifically allowed by your doctor.  Talk to your doctor before resuming sexual activity.  You should not drive until cleared by your doctor.  Until released by your doctor, you should not return to work or school.  You should rest at home and let your body heal.   You may shower three days after your surgery.  After showering, lightly dab your incision dry. Do not take a tub bath or go swimming until approved by your doctor at your  follow-up appointment.  If your doctor ordered a cervical collar (neck brace) for you, you should wear it whenever you are out of bed. You may remove it when lying down or sleeping, but you should wear it at all other times. Not all neck surgeries require a cervical collar.  If you smoke, we strongly recommend that you quit.  Smoking has been proven to interfere with normal bone healing and will dramatically reduce the success rate of your surgery. Please contact [facility] (800-QUIT-NOW) and use the resources at www.[facility].com for assistance in stopping smoking.  Surgical Incision   If you have a dressing on your incision, you may remove it two days after your surgery. Keep your incision area clean and dry.  Your incisions were closed with Dermabond glue. The glue should begin to peel away within about a week. Diet           You may return to your usual diet. However, you may experience discomfort when swallowing in the first month after your surgery. This is normal. You may find that softer foods are more comfortable for you to swallow. Be sure to stay hydrated.  When to Contact us  You may experience pain in your neck and/or pain between your shoulder blades. This is normal and should improve in the next few weeks with the help of pain medication, muscle relaxers, and rest. Some patients report that a warm compress on the back of the neck or between the shoulder blades helps.  However, should you experience any of the following, contact us immediately: New numbness or weakness Pain that  is progressively getting worse, and is not relieved by your pain medication, muscle relaxers, rest, and warm compresses Bleeding, redness, swelling, pain, or drainage from surgical incision Chills or flu-like symptoms Fever greater than 101.0 F (38.3 C) Inability to eat, drink fluids, or take medications Problems with bowel or bladder functions Difficulty breathing or shortness of breath Warmth,  tenderness, or swelling in your calf Contact Information During office hours (Monday-Friday 9 am to 5 pm), please call your physician at [phone] and ask for Sharlot Gowda After hours and weekends, please call [phone] and speak with the answering service, who will contact the doctor on call.  If that fails, call the [facility] Operator at [phone] and ask for the Neurosurgery Resident On Call  For a life-threatening emergency, call 911   AMBULATORY SURGERY  DISCHARGE INSTRUCTIONS   The drugs that you were given will stay in your system until tomorrow so for the next 24 hours you should not:  Drive an automobile Make any legal decisions Drink any alcoholic beverage   You may resume regular meals tomorrow.  Today it is better to start with liquids and gradually work up to solid foods.  You may eat anything you prefer, but it is better to start with liquids, then soup and crackers, and gradually work up to solid foods.   Please notify your doctor immediately if you have any unusual bleeding, trouble breathing, redness and pain at the surgery site, drainage, fever, or pain not relieved by medication.    Additional Instructions:        Please contact your physician with any problems or Same Day Surgery at [phone], Monday through Friday 6 am to 4 pm, or [facility] number at [phone].

## 2020-12-18 NOTE — Transfer of Care (Signed)
Immediate Anesthesia Transfer of Care Note  Patient: Sheila Wallace  Procedure(s) Performed: REMOVAL OF SPINAL CORD STIMULATOR AND PULSE GENERATOR (Back) C6/7 ANTERIOR CERVICAL DECOMPRESSION/DISCECTOMY FUSION 1 LEVEL (Neck)  Patient Location: PACU  Anesthesia Type:General  Level of Consciousness: drowsy and patient cooperative  Airway & Oxygen Therapy: Patient Spontanous Breathing and Patient connected to face mask oxygen  Post-op Assessment: Report given to RN and Post -op Vital signs reviewed and stable  Post vital signs: Reviewed and stable  Last Vitals:  Vitals Value Taken Time  BP 119/81 12/18/20 1008  Temp 35.9 C 12/18/20 1008  Pulse 71 12/18/20 1014  Resp 18 12/18/20 1014  SpO2 100 % 12/18/20 1014  Vitals shown include unvalidated device data.  Last Pain:  Vitals:   12/18/20 1008  TempSrc:   PainSc: Asleep         Complications: No notable events documented.

## 2020-12-18 NOTE — Anesthesia Procedure Notes (Signed)
Procedure Name: Intubation Date/Time: 12/18/2020 7:31 AM Performed by: Henrietta Hoover, CRNA Pre-anesthesia Checklist: Patient identified, Emergency Drugs available, Suction available and Patient being monitored Patient Re-evaluated:Patient Re-evaluated prior to induction Oxygen Delivery Method: Circle system utilized Preoxygenation: Pre-oxygenation with 100% oxygen Induction Type: IV induction Ventilation: Mask ventilation without difficulty Laryngoscope Size: 3 and McGraph Grade View: Grade I Tube type: Oral Tube size: 6.5 mm Number of attempts: 1 Airway Equipment and Method: Stylet and Video-laryngoscopy (Elective use of videoscope) Placement Confirmation: ETT inserted through vocal cords under direct vision, positive ETCO2 and breath sounds checked- equal and bilateral Secured at: 20 cm Tube secured with: Tape Dental Injury: Teeth and Oropharynx as per pre-operative assessment

## 2020-12-18 NOTE — Anesthesia Preprocedure Evaluation (Addendum)
Anesthesia Evaluation  Patient identified by MRN, date of birth, ID band Patient awake    Reviewed: Allergy & Precautions, H&P , NPO status , Patient's Chart, lab work & pertinent test results  Airway Mallampati: II  TM Distance: >3 FB Neck ROM: Limited    Dental  (+) Caps   Pulmonary asthma ,    Pulmonary exam normal        Cardiovascular Exercise Tolerance: Good Normal cardiovascular exam+ Valvular Problems/Murmurs (bicuspid aortic valve)  Rhythm:Regular Rate:Normal     Neuro/Psych PSYCHIATRIC DISORDERS Anxiety Chronic pain s/p spinal cord stimulator  Neuromuscular disease (Cervical radiculopathy )    GI/Hepatic negative GI ROS, Neg liver ROS,   Endo/Other  negative endocrine ROS  Renal/GU negative Renal ROS  negative genitourinary   Musculoskeletal  (+) Arthritis ,   Abdominal Normal abdominal exam  (+)   Peds negative pediatric ROS (+)  Hematology negative hematology ROS (+)   Anesthesia Other Findings   Reproductive/Obstetrics negative OB ROS                            Anesthesia Physical Anesthesia Plan  ASA: 2  Anesthesia Plan: General   Post-op Pain Management:    Induction: Intravenous  PONV Risk Score and Plan: 3 and Ondansetron, Dexamethasone and Midazolam  Airway Management Planned: Oral ETT  Additional Equipment:   Intra-op Plan:   Post-operative Plan: Extubation in OR  Informed Consent: I have reviewed the patients History and Physical, chart, labs and discussed the procedure including the risks, benefits and alternatives for the proposed anesthesia with the patient or authorized representative who has indicated his/her understanding and acceptance.     Dental advisory given  Plan Discussed with: CRNA and Anesthesiologist  Anesthesia Plan Comments:         Anesthesia Quick Evaluation

## 2020-12-18 NOTE — Interval H&P Note (Signed)
History and Physical Interval Note:  12/18/2020 6:38 AM  Sheila Wallace  has presented today for surgery, with the diagnosis of Chronic pain G89.4 Cervical radiculopathy M54.12.  The various methods of treatment have been discussed with the patient and family. After consideration of risks, benefits and other options for treatment, the patient has consented to  Procedure(s): REMOVAL OF SPINAL CORD STIMULATOR AND PULSE GENERATOR (N/A) C6/7 ANTERIOR CERVICAL DECOMPRESSION/DISCECTOMY FUSION 1 LEVEL (N/A) as a surgical intervention.  The patient's history has been reviewed, patient examined, no change in status, stable for surgery.  I have reviewed the patient's chart and labs.  Questions were answered to the patient's satisfaction.     [name]

## 2020-12-18 NOTE — Anesthesia Postprocedure Evaluation (Signed)
Anesthesia Post Note  Patient: Proofreader  Procedure(s) Performed: REMOVAL OF SPINAL CORD STIMULATOR AND PULSE GENERATOR (Back) C6/7 ANTERIOR CERVICAL DECOMPRESSION/DISCECTOMY FUSION 1 LEVEL (Neck)  Patient location during evaluation: PACU Anesthesia Type: General Level of consciousness: awake and alert Pain management: pain level controlled Vital Signs Assessment: post-procedure vital signs reviewed and stable Respiratory status: spontaneous breathing, nonlabored ventilation and respiratory function stable Cardiovascular status: blood pressure returned to baseline and stable Postop Assessment: no apparent nausea or vomiting Anesthetic complications: no   No notable events documented.   Last Vitals:  Vitals:   12/18/20 1315 12/18/20 1401  BP: 123/89 109/81  Pulse: 77 73  Resp: 16 16  Temp:    SpO2: 99% 99%    Last Pain:  Vitals:   12/18/20 1224  TempSrc:   PainSc: 6                  Foye Deer

## 2020-12-18 NOTE — Op Note (Signed)
Operative Note   SURGERY DATE: 12/18/2020   PRE-OP DIAGNOSIS: Cervical spinal stenosis with radiculopathy    POST-OP DIAGNOSIS: Post-Op Diagnosis Codes: Cervical spinal stenosis with radiculopathy   Procedure(s) with comments: C5-6 anterior cervical discectomy and fusion   SURGEON:     * Nathaniel Man, MD       Manning Charity, PA Assistant   ANESTHESIA: General    OPERATIVE FINDINGS: Disc herniation at L2/3   Procedure Indications Sheila Wallace presented to our clinic on 11/1 with ongoing left arm pain and numbness.  He has also had a previous spinal cord stimulator placed this was not providing any relief.  Given her left arm symptoms, she did have a CT myelogram which demonstrated left foraminal stenosis at the C6-7 level.  Given this, we recommended an anterior cervical decompression and fusion to relieve the pressure on the nerves. The risks of hematoma, infection, poor bone healing and failure of fusion, cord injury, weakness, numbness, neck pain, stroke, and death were discussed in detail.   The patient also requests removal of her cervical spinal cord stimulator to allow for MRIs in the future.  She understands that replacement is difficult but she is currently not using it. All questions were answered and the patient elected to proceed with the surgery.     Procedure After obtaining informed consent, the patient was taken to the Operating Room where general anesthesia was induced and the patient intubated. Vascular access was obtained.  A Foley catheter was placed. Decadron was administered.   The patient was first placed prone for the removal of the stimulator.  Once in position, the prior incisions were marked.  The area was prepped sterilely and draped.  A timeout was performed.  Local anesthetic was used in the incisions.  They were opened sharply and in the midline the stimulator wire was identified.  The anchor was dissected and removed.  The lead was then retracted from  the epidural space and seen to be intact.  Next, the left flank incision was also opened and the battery identified.  This was removed and the wire cut.  All hardware was removed.  Hemostasis was achieved and the area was irrigated profusely.  The incisions were closed with 0, 2-0 Vicryl and Dermabond on the skin.  Next the patient was turned to the supine position for the anterior surgery. The head was slightly extended and imaging used to identify a skin crease overlying the C6 vertebral body. The patient was prepped and draped in the usual sterile fashion and a timeout was performed per protocol. Local anesthesia was instilled with epinephrine along the planned incision site. A transverse cervical incision was performed on the left in a skin crease. The incision was carried to the level of the platysma and then cautery was used to incise the muscle. Blunt dissection was used to expand the plane and the dissection was carried deep medial to the SCM and carotid sheath being careful to identify the trachea and esophagus medially. The prevertebral fascia was identified and this was bluntly dissected to expose the disc spaces. A needle was placed in the disc space and x-ray confirmed the C6-7 disc level.     Next, cautery was used to undermine the longus colli muscles bilaterally and identify the C6/7 disc space. Caspar pins were placed at C7 and C6 and a retractor system placed under the muscles to complete the exposure. Next, a combination of curettes and Kerrison rongeurs were used to remove the anterior  osteophyte at C6-7 and then the disc material. A 69mm matchstick was used to shave the endplates of the adjacent bodies. A trial spacer was used to size the graft and then hemostasis obtained. The microscope was brought into the field for the remainder of the surgery. The drill was used to remove the osteophyte/disc complex deep to the level of the PLL at C6-7.  There was significant disc protrusion at this  level that was removed with rongeurs. The PLL was entered with a hook and then the PLL was removed along with remaining disc material to decompress centrally and then out into bilateral neuro foramen. A blunt probe was used to confirm no residual stenosis laterally and a curette used to ensure no posterior osteophyte remained. Floseal was used for hemostasis. Allograft was placed, 43mm in height and placed slightly recessed to the anterior edge of vertebral body   X-ray confirmed good placement of graft.  The caspar pins were removed and bone wax placed. The remainder of the osteophytes were removed to allow for plating. Next, a 77mm plate was found to be the adequate size and was placed in the midline and secured with two 17mm screws at each bone level. Xray was obtained confirming good graft placement and adequate depth of screws. The retractors were removed. The wound was irrigated copiously and hemostasis obtained. The platysma was closed with 2-0 vicryl suture. The dermis was closed with 3-0 Vicryl and Dermabond was placed on the skin.    The patient had general anesthesia reversed and was extubated following the procedure. She awoke following commands with symmetric movement. She was taken to the PACU where continued recovery and then the ward.     ESTIMATED BLOOD LOSS:   100 cc   SPECIMENS None   IMPLANT spinal cord stimulator and pulse generator  Inventory Item:  Serial no.:  Model/Cat no.:   Implant name: spinal cord stimulator and pulse generator Laterality: N/A Area: Back   Manufacturer:  Date of Manufacture:    Action: Explanted Number Used: 1   Device Identifier:  Device Identifier Type:     CORNERSTONE [id number] LORDOTI - [id number]  Inventory Item: CORNERSTONE [id number] LORDOTI Serial no.: [id number] Model/Cat no.: [id number]  Implant name: CORNERSTONE [id number] LORDOTI - [id number] Laterality: N/A Area: Spine Cervical  Manufacturer: [employer] Date of Manufacture:     Action: Implanted Number Used: 1   Device Identifier:  Device Identifier Type:     PLATE ZEVO 1LVL - [id number]  Inventory Item: PLATE ZEVO 1LVL Serial no.:  Model/Cat no.: [id number]  Implant name: PLATE ZEVO 1LVL - [id number] Laterality: N/A Area: Spine Cervical  Manufacturer: MEDTRON[employer] Date of Manufacture:    Action: Implanted Number Used: 1   Device Identifier:  Device Identifier Type:     SCREW - [id number]  Inventory Item: SCREW Serial no.:  Model/Cat no.: [id number]  Implant name: SCREW - [id number] Laterality: N/A Area: Spine Cervical  Manufacturer: MEDTRONIC [facility] Date of Manufacture:    Action: Implanted Number Used: 4   Device Identifier:  Device Identifier Type:         I performed the case in its entirety with assistance of PA, Flora Lipps, MD [phone]

## 2020-12-19 ENCOUNTER — Encounter: Payer: Self-pay | Admitting: Neurosurgery

## 2021-01-05 ENCOUNTER — Encounter: Payer: Self-pay | Admitting: Neurosurgery

## 2021-02-27 ENCOUNTER — Ambulatory Visit (INDEPENDENT_AMBULATORY_CARE_PROVIDER_SITE_OTHER): Payer: Managed Care, Other (non HMO)

## 2021-02-27 ENCOUNTER — Other Ambulatory Visit: Payer: Self-pay

## 2021-02-27 ENCOUNTER — Ambulatory Visit
Admission: EM | Admit: 2021-02-27 | Discharge: 2021-02-27 | Disposition: A | Discharge status: Home / Self Care | Payer: Managed Care, Other (non HMO)

## 2021-02-27 DIAGNOSIS — J4521 Mild intermittent asthma with (acute) exacerbation: Secondary | ICD-10-CM | POA: Diagnosis not present

## 2021-02-27 DIAGNOSIS — R059 Cough, unspecified: Secondary | ICD-10-CM

## 2021-02-27 DIAGNOSIS — J069 Acute upper respiratory infection, unspecified: Secondary | ICD-10-CM

## 2021-02-27 DIAGNOSIS — R509 Fever, unspecified: Secondary | ICD-10-CM

## 2021-02-27 DIAGNOSIS — R0602 Shortness of breath: Secondary | ICD-10-CM | POA: Diagnosis not present

## 2021-02-27 MED ORDER — IPRATROPIUM BROMIDE 0.06 % NA SOLN: 2.0000 | Freq: Four times a day (QID) | NASAL | 12 refills | Status: AC

## 2021-02-27 MED ORDER — PREDNISONE 20 MG PO TABS: 60.0000 mg | ORAL_TABLET | Freq: Every day | ORAL | 0 refills | Status: AC

## 2021-02-27 MED ORDER — BENZONATATE 100 MG PO CAPS: 200.0000 mg | ORAL_CAPSULE | Freq: Three times a day (TID) | ORAL | 0 refills | Status: AC

## 2021-02-27 MED ORDER — PROMETHAZINE-DM 6.25-15 MG/5ML PO SYRP: 5.0000 mL | ORAL_SOLUTION | Freq: Four times a day (QID) | ORAL | 0 refills | Status: AC | PRN

## 2021-02-27 NOTE — ED Triage Notes (Signed)
Pt reports chest congestion, cough, nasal congestion, fatigue and fever 100.9 F x 1 day. Pt reports chest congestion and fatigue improves after using albuterol inhaler, Flonase and Mucinex.   Pt had a negative COVID d Flu test done at [facility] yesterday.

## 2021-02-27 NOTE — Discharge Instructions (Addendum)
Your chest x-ray today did not show the presence of pneumonia.  I believe that your upper respiratory infection is caused an exacerbation of your asthma.  I want you to continue using your albuterol inhaler, 1 to 2 puffs every 4-6 hours, as needed for shortness of breath, chest tightness, or wheezing.  Also for cough.  When she was start prednisone 60 mg this morning.  You will take it each morning at breakfast going forward for total of 5 days.  Make sure you eat before you take it.  I am going to give you Atrovent nasal spray and you can do 2 squirts up each nostril every 6 hours as needed for nasal congestion and postnasal drip.  Use the Tessalon Perles every 8 hours during the day as needed for cough.  They may give you a metallic taste or numbness to your base your tongue, this is normal.  Use the Promethazine DM cough syrup at bedtime.  It is an antihistamine and will make you drowsy but will also dry for nasal congestion.  Return for reevaluation, or see your PCP, for new or worsening symptoms.

## 2021-02-27 NOTE — ED Provider Notes (Signed)
[facility]    CSN: [id number] Arrival date & time: 02/27/21  0806      History   Chief Complaint Chief Complaint  Patient presents with   Nasal Congestion   Fever    HPI Sheila Wallace is a 46 y.o. female.   HPI  46 year old female here for evaluation of respiratory complaints.  Patient reports that she has been experiencing respiratory symptoms for the last 3 days that include fever with a Tmax of 100.9, left ear pain, sore throat, nasal congestion with yellow nasal discharge, productive cough for yellow sputum, shortness of breath, nausea, fatigue, decreased appetite, body aches, and headache.  She does have a history of asthma and is on albuterol and Qvar which she has been taking.  She has had to increase her albuterol inhaler use to help with the shortness of breath and chest congestion.  She denies any wheezing, vomiting, or diarrhea.  She did have a respiratory triplex panel collected at [facility] clinic yesterday which was negative for COVID or influenza.  Patient was not evaluated in person only had labs performed.  Past Medical History:  Diagnosis Date   Alopecia    Anxiety    Bicuspid aortic valve    Cervicalgia    Congenital insufficiency of aortic valve    Dyspnea    Facet arthritis of cervical region    Heart palpitations    Mild intermittent asthma without complication    Pneumonia    Pre-diabetes    Spinal headache    patient reports that with MRI with contrast that she has spinal leakaged that required blood patch   Vitamin B12 deficiency     There are no problems to display for this patient.   Past Surgical History:  Procedure Laterality Date   ABDOMINAL HYSTERECTOMY     ANTERIOR CERVICAL DECOMP/DISCECTOMY FUSION N/A 12/18/2020   Procedure: C6/7 ANTERIOR CERVICAL DECOMPRESSION/DISCECTOMY FUSION 1 LEVEL;  Surgeon: Deetta Perla, MD;  Location: [facility] ORS;  Service: Neurosurgery;  Laterality: N/A;   APPENDECTOMY     nasal polyp removed      ovarian cyst removed     x 3   placement of spinal cord stimulator     SPINAL CORD STIMULATOR REMOVAL N/A 12/18/2020   Procedure: REMOVAL OF SPINAL CORD STIMULATOR AND PULSE GENERATOR;  Surgeon: Deetta Perla, MD;  Location: [facility] ORS;  Service: Neurosurgery;  Laterality: N/A;    OB History   No obstetric history on file.      Home Medications    Prior to Admission medications   Medication Sig Start Date End Date Taking? Authorizing Provider  benzonatate (TESSALON) 100 MG capsule Take 2 capsules (200 mg total) by mouth every 8 (eight) hours. 02/27/21  Yes Margarette Canada, NP  fluticasone [facility]) 50 MCG/ACT nasal spray Place into both nostrils daily.   Yes [provider]  ipratropium (ATROVENT) 0.06 % nasal spray Place 2 sprays into both nostrils 4 (four) times daily. 02/27/21  Yes Margarette Canada, NP  predniSONE (DELTASONE) 20 MG tablet Take 3 tablets (60 mg total) by mouth daily with breakfast for 5 days. 3 tablets daily for 5 days. 02/27/21 03/04/21 Yes Margarette Canada, NP  promethazine-dextromethorphan (PROMETHAZINE-DM) 6.25-15 MG/5ML syrup Take 5 mLs by mouth 4 (four) times daily as needed. 02/27/21  Yes Margarette Canada, NP  acetaminophen (TYLENOL) 325 MG tablet Take 650 mg by mouth every 6 (six) hours as needed for moderate pain.    [provider]  albuterol (VENTOLIN HFA) 108 (90  Base) MCG/ACT inhaler Inhale 2 puffs into the lungs every 6 (six) hours as needed for wheezing or shortness of breath.    [provider]  busPIRone (BUSPAR) 5 MG tablet Take 5 mg by mouth daily.    [provider]  cholecalciferol (VITAMIN D3) 25 MCG (1000 UNIT) tablet Take 1,000 Units by mouth daily.    [provider]  escitalopram (LEXAPRO) 20 MG tablet Take 20 mg by mouth daily. 02/12/21   [provider]  loratadine (CLARITIN) 10 MG tablet Take 10 mg by mouth daily as needed for allergies.    [provider]  methocarbamol (ROBAXIN) 500 MG tablet Take 1  tablet (500 mg total) by mouth 4 (four) times daily. 12/18/20   Loleta Dicker, PA  senna (SENOKOT) 8.6 MG TABS tablet Take 1 tablet (8.6 mg total) by mouth daily as needed for mild constipation. 12/18/20   Loleta Dicker, PA  vitamin B-12 (CYANOCOBALAMIN) 1000 MCG tablet Take 1,000 mcg by mouth daily.    [provider]    Family History History reviewed. No pertinent family history.  Social History Social History   Tobacco Use   Smoking status: Never    Passive exposure: Never   Smokeless tobacco: Never  Vaping Use   Vaping Use: Never used  Substance Use Topics   Alcohol use: Yes    Alcohol/week: 1.0 standard drink    Types: 1 Glasses of wine per week    Comment: rarely   Drug use: Never     Allergies   Penicillins   Review of Systems Review of Systems  Constitutional:  Positive for appetite change, chills, fatigue and fever.  HENT:  Positive for congestion, ear pain, rhinorrhea and sore throat.   Respiratory:  Positive for cough, chest tightness and shortness of breath. Negative for wheezing.   Cardiovascular:  Negative for chest pain.  Musculoskeletal:  Positive for arthralgias and myalgias.  Neurological:  Positive for headaches.  Hematological: Negative.   Psychiatric/Behavioral: Negative.      Physical Exam Triage Vital Signs ED Triage Vitals  Enc Vitals Group     BP 02/27/21 0838 (!) 141/98     Pulse Rate 02/27/21 0838 78     Resp 02/27/21 0838 18     Temp 02/27/21 0838 98.7 F (37.1 C)     Temp Source 02/27/21 0838 Oral     SpO2 02/27/21 0838 99 %     Weight --      Height --      Head Circumference --      Peak Flow --      Pain Score 02/27/21 0841 0     Pain Loc --      Pain Edu? --      Excl. in [place]? --    No data found.  Updated Vital Signs BP (!) 141/98 (BP Location: Left Arm)    Pulse 78    Temp 98.7 F (37.1 C) (Oral)    Resp 18    SpO2 99%   Visual Acuity Right Eye Distance:   Left Eye Distance:   Bilateral  Distance:    Right Eye Near:   Left Eye Near:    Bilateral Near:     Physical Exam Vitals and nursing note reviewed.  Constitutional:      Appearance: Normal appearance. She is not ill-appearing.  HENT:     Head: Normocephalic and atraumatic.     Right Ear: Tympanic membrane, ear canal and external  ear normal. There is no impacted cerumen.     Left Ear: Tympanic membrane, ear canal and external ear normal. There is no impacted cerumen.     Nose: Congestion and rhinorrhea present.     Mouth/Throat:     Mouth: Mucous membranes are moist.     Pharynx: Oropharynx is clear. Posterior oropharyngeal erythema present.  Cardiovascular:     Rate and Rhythm: Normal rate and regular rhythm.     Pulses: Normal pulses.     Heart sounds: Normal heart sounds. No murmur heard.   No friction rub. No gallop.  Pulmonary:     Effort: Pulmonary effort is normal.     Breath sounds: Normal breath sounds. No wheezing, rhonchi or rales.  Musculoskeletal:     Cervical back: Normal range of motion and neck supple.  Lymphadenopathy:     Cervical: No cervical adenopathy.  Skin:    General: Skin is warm and dry.     Capillary Refill: Capillary refill takes less than 2 seconds.     Findings: No erythema or rash.  Neurological:     General: No focal deficit present.     Mental Status: She is alert and oriented to person, place, and time.  Psychiatric:        Mood and Affect: Mood normal.        Behavior: Behavior normal.        Thought Content: Thought content normal.        Judgment: Judgment normal.     UC Treatments / Results  Labs (all labs ordered are listed, but only abnormal results are displayed) Labs Reviewed - No data to display  EKG   Radiology DG Chest 2 View  Result Date: 02/27/2021 CLINICAL DATA:  Cough, fever, and shortness of breath for 1 day. Fatigue. Asthma. EXAM: CHEST - 2 VIEW COMPARISON:  None. FINDINGS: The heart size and mediastinal contours are within normal limits.  Mild tortuosity of thoracic aorta noted. Both lungs are clear. Lower cervical spine fusion hardware noted. IMPRESSION: No active cardiopulmonary disease. Electronically Signed   By: Marlaine Hind M.D.   On: 02/27/2021 10:56    Procedures Procedures (including critical care time)  Medications Ordered in UC Medications - No data to display  Initial Impression / Assessment and Plan / UC Course  I have reviewed the triage vital signs and the nursing notes.  Pertinent labs & imaging results that were available during my care of the patient were reviewed by me and considered in my medical decision making (see chart for details).  Patient is a pleasant, nontoxic-appearing 87 old female here for evaluation of 3 days worth of respiratory symptoms as outlined in HPI above.  She had a negative test for COVID and influenza at [facility] clinic yesterday but was not evaluated in person.  She does have a history of asthma and reports increased use of her albuterol inhaler to help with her shortness of breath and chest tightness.  She is also been experiencing fatigue.  She does have a nebulizer machine at home but states she is out of the medicine for the neb.  On exam patient has pearly gray tympanic membranes bilaterally with normal light reflex and clear external auditory canals.  Nasal mucosa is erythematous and edematous with yellow discharge in both nares.  She does have tenderness to percussion of bilateral frontal and maxillary sinuses.  Oropharyngeal exam reveals mild posterior oropharyngeal erythema with yellow postnasal drip.  No cervical lymphadenopathy appreciated on exam.  Cardiopulmonary exam reveals clear lung sounds in all fields and her heart sounds are S1-S2 and regular rate and rhythm.  Given her fever, fatigue, and sputum production I am concerned that she may have pneumonia so I will obtain a chest x-ray.  It is also possible that her sputum production is coming from her upper respiratory tract as  result of the postnasal drip.  If the chest x-ray is negative I will discharge her home and treat her for an asthma exacerbation and upper respiratory infection.  Chest x-ray independently reviewed by me.  Impression: Patient's lung fields are well pneumatized.  No evidence of infiltrate.  Pulmonary vasculature is mildly prominent.  Mediastinal silhouette is normal in appearance.  Radiology overread is pending. Radiology impression states heart size and mediastinal contours are within normal limits.  Mild tortuosity of thoracic aorta noted.  Both lungs are clear.  Lower cervical spine fusion hardware noted.  No active cardiopulmonary disease.  We will discharge patient home with a diagnosis of URI and asthma exacerbation.  I will give her Atrovent nasal spray to help with nasal congestion, Tessalon Perles and Promethazine DM cough subtype with cough.  I muscular start her on prednisone 60 mg daily to help with pulmonary inflammation.  Final Clinical Impressions(s) / UC Diagnoses   Final diagnoses:  Upper respiratory tract infection, unspecified type  Mild intermittent asthma with acute exacerbation     Discharge Instructions      Your chest x-ray today did not show the presence of pneumonia.  I believe that your upper respiratory infection is caused an exacerbation of your asthma.  I want you to continue using your albuterol inhaler, 1 to 2 puffs every 4-6 hours, as needed for shortness of breath, chest tightness, or wheezing.  Also for cough.  When she was start prednisone 60 mg this morning.  You will take it each morning at breakfast going forward for total of 5 days.  Make sure you eat before you take it.  I am going to give you Atrovent nasal spray and you can do 2 squirts up each nostril every 6 hours as needed for nasal congestion and postnasal drip.  Use the Tessalon Perles every 8 hours during the day as needed for cough.  They may give you a metallic taste or numbness to your  base your tongue, this is normal.  Use the Promethazine DM cough syrup at bedtime.  It is an antihistamine and will make you drowsy but will also dry for nasal congestion.  Return for reevaluation, or see your PCP, for new or worsening symptoms.     ED Prescriptions     Medication Sig Dispense Auth. Provider   predniSONE (DELTASONE) 20 MG tablet Take 3 tablets (60 mg total) by mouth daily with breakfast for 5 days. 3 tablets daily for 5 days. 15 tablet Margarette Canada, NP   ipratropium (ATROVENT) 0.06 % nasal spray Place 2 sprays into both nostrils 4 (four) times daily. 15 mL Margarette Canada, NP   benzonatate (TESSALON) 100 MG capsule Take 2 capsules (200 mg total) by mouth every 8 (eight) hours. 21 capsule Margarette Canada, NP   promethazine-dextromethorphan (PROMETHAZINE-DM) 6.25-15 MG/5ML syrup Take 5 mLs by mouth 4 (four) times daily as needed. 118 mL Margarette Canada, NP      PDMP not reviewed this encounter.   Margarette Canada, NP 02/27/21 1133

## 2021-10-18 ENCOUNTER — Ambulatory Visit: Payer: Managed Care, Other (non HMO)

## 2023-07-17 IMAGING — RF DG CERVICAL SPINE 1V
1 series · 3 of 3 positions shown · non-contrast
Comparison: October 13, 2020.

CLINICAL DATA: Cervical spine surgery.

EXAM:
DG CERVICAL SPINE - 1 VIEW; DG C-ARM 1-60 MIN-NO REPORT
Radiation exposure index: 0.5164 mGy.

[Series 1: dg x-ray · 0.20mm/px · 3 of 3 slices shown]
[im 1/3]
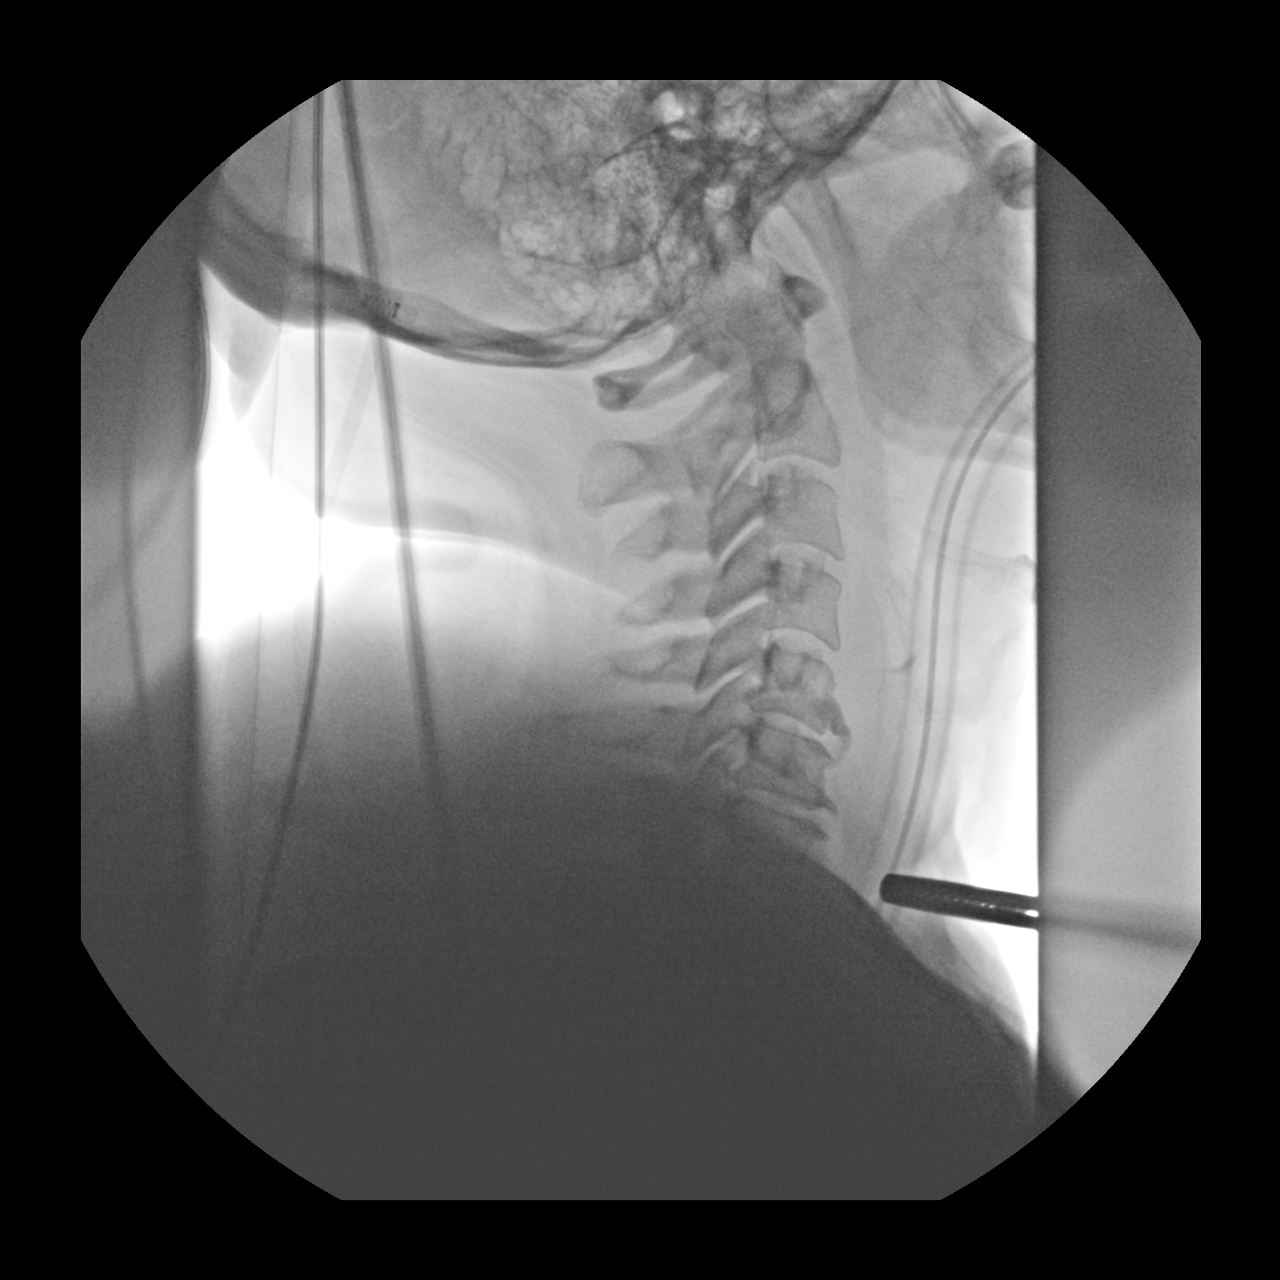
[im 2/3]
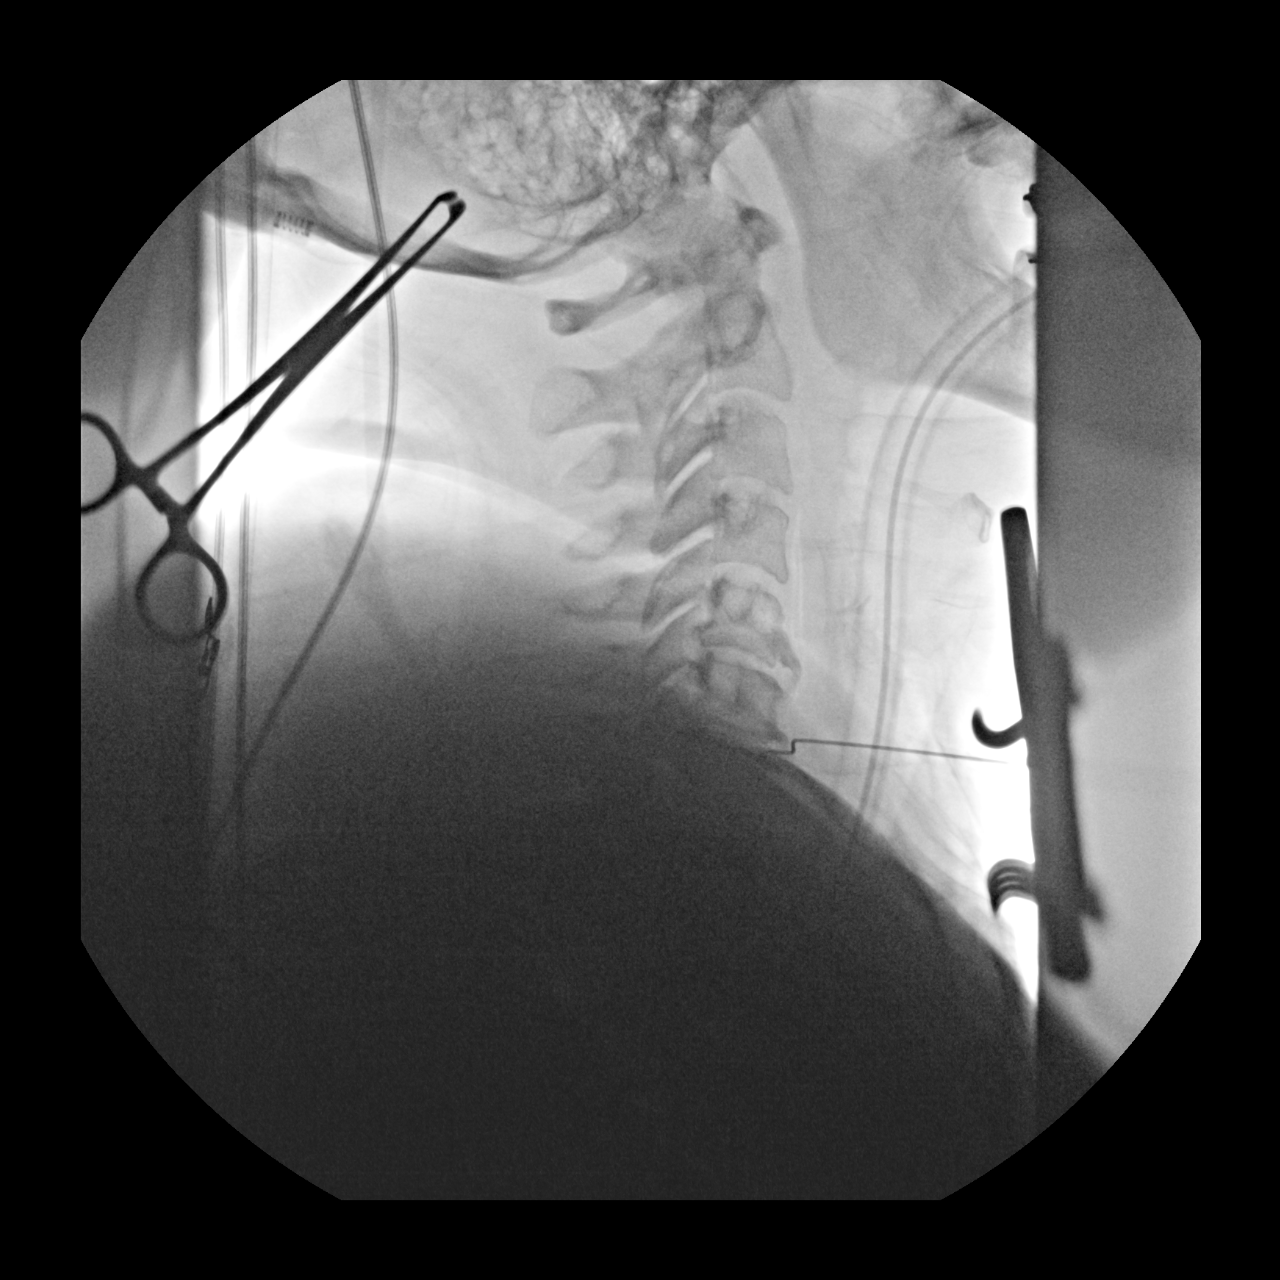
[im 3/3]
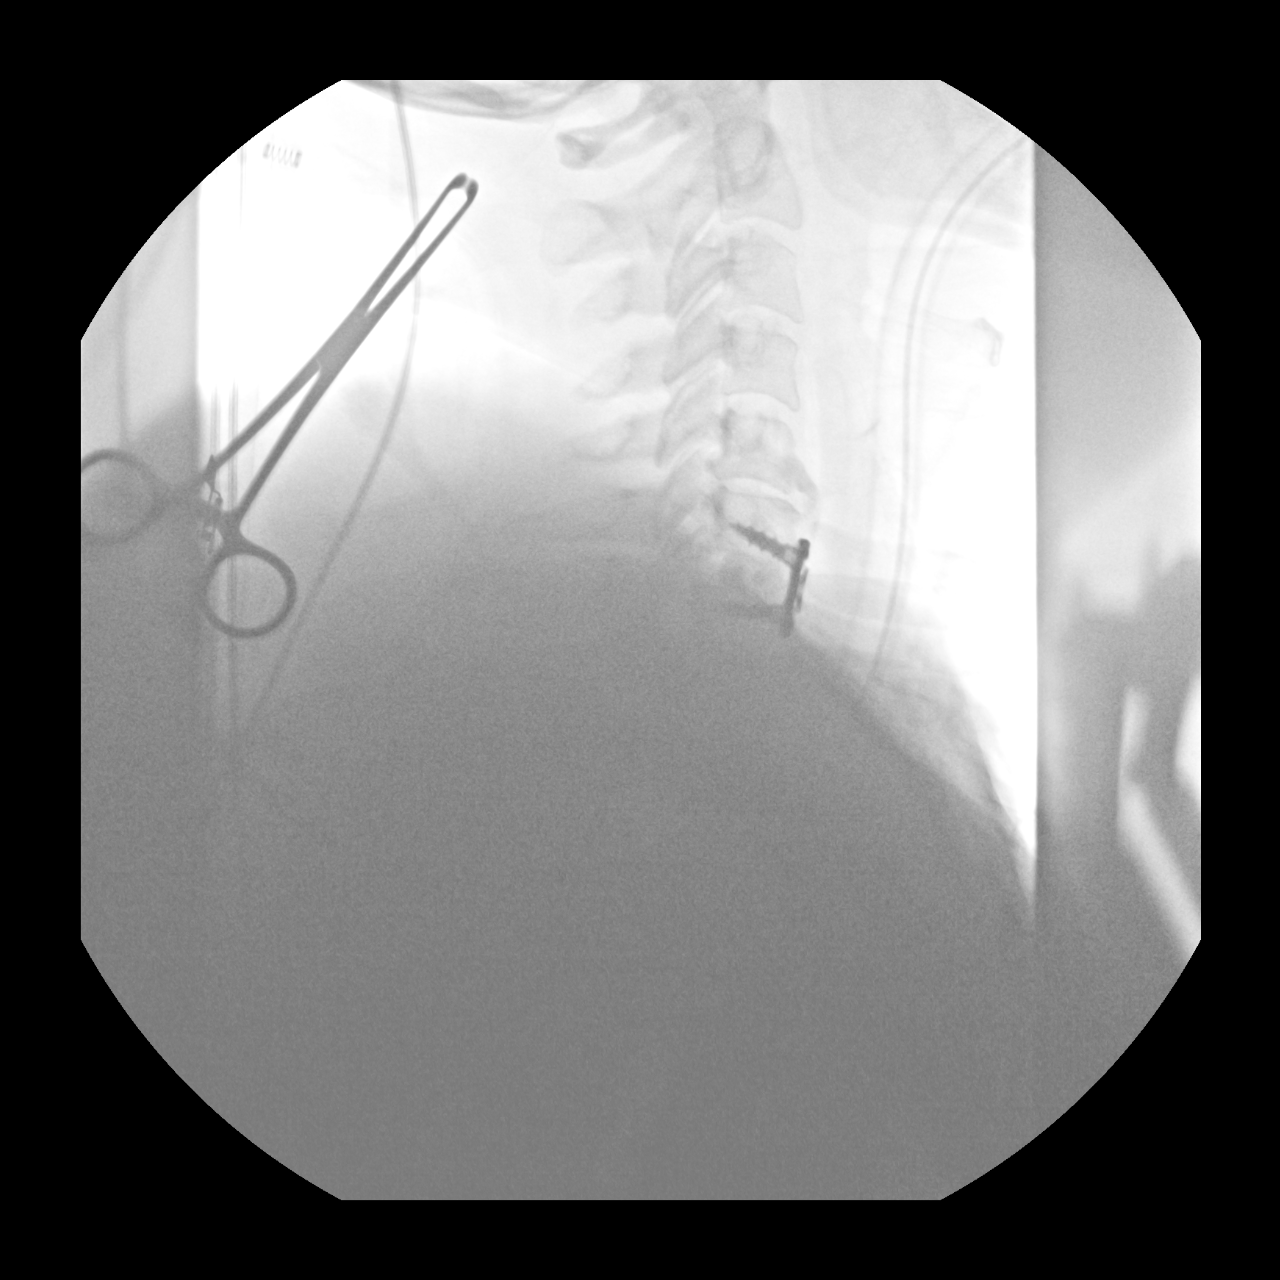

[3 of 3 positions shown; findings below may reference images not displayed]

FINDINGS: Three intraoperative fluoroscopic images were obtained of the
cervical spine. These images demonstrate surgical anterior fusion of
C6-7.
IMPRESSION: Fluoroscopic guidance provided during surgical anterior fusion of
C6-7.
